# Patient Record
Sex: Male | Born: 2000 | Race: Black or African American | Hispanic: No | Marital: Single | Smoking: Never smoker
Health system: Southern US, Community
[De-identification: ages and names within clinical notes are randomized; demographics above are authoritative.]

## PROBLEM LIST (undated history)

## (undated) DIAGNOSIS — I498 Other specified cardiac arrhythmias: Secondary | ICD-10-CM

## (undated) HISTORY — PX: ADENOIDECTOMY: SUR15

## (undated) HISTORY — PX: TYMPANOPLASTY: SHX33

---
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
## 2020-02-01 ENCOUNTER — Ambulatory Visit: Payer: Self-pay

## 2020-02-03 ENCOUNTER — Ambulatory Visit: Payer: BLUE CROSS/BLUE SHIELD | Attending: Internal Medicine

## 2020-02-03 DIAGNOSIS — Z20822 Contact with and (suspected) exposure to covid-19: Secondary | ICD-10-CM

## 2020-02-04 LAB — NOVEL CORONAVIRUS, NAA: SARS-CoV-2, NAA: NOT DETECTED

## 2022-06-29 ENCOUNTER — Other Ambulatory Visit: Payer: Self-pay

## 2022-06-29 ENCOUNTER — Encounter (HOSPITAL_COMMUNITY): Payer: Self-pay | Admitting: Emergency Medicine

## 2022-06-29 ENCOUNTER — Emergency Department (HOSPITAL_COMMUNITY)
Admission: EM | Admit: 2022-06-29 | Discharge: 2022-06-29 | Discharge status: Against Medical Advice | Payer: BLUE CROSS/BLUE SHIELD | Attending: Emergency Medicine | Admitting: Emergency Medicine

## 2022-06-29 DIAGNOSIS — R197 Diarrhea, unspecified: Secondary | ICD-10-CM | POA: Insufficient documentation

## 2022-06-29 DIAGNOSIS — R42 Dizziness and giddiness: Secondary | ICD-10-CM | POA: Insufficient documentation

## 2022-06-29 DIAGNOSIS — R112 Nausea with vomiting, unspecified: Secondary | ICD-10-CM | POA: Insufficient documentation

## 2022-06-29 DIAGNOSIS — Z5321 Procedure and treatment not carried out due to patient leaving prior to being seen by health care provider: Secondary | ICD-10-CM | POA: Insufficient documentation

## 2022-06-29 DIAGNOSIS — R109 Unspecified abdominal pain: Secondary | ICD-10-CM | POA: Insufficient documentation

## 2022-06-29 LAB — COMPREHENSIVE METABOLIC PANEL
ALT: 32 U/L (ref 0–44)
AST: 29 U/L (ref 15–41)
Albumin: 4.4 g/dL (ref 3.5–5.0)
Alkaline Phosphatase: 77 U/L (ref 38–126)
Anion gap: 8 (ref 5–15)
BUN: 11 mg/dL (ref 6–20)
CO2: 27 mmol/L (ref 22–32)
Calcium: 9.3 mg/dL (ref 8.9–10.3)
Chloride: 103 mmol/L (ref 98–111)
Creatinine, Ser: 1.2 mg/dL (ref 0.61–1.24)
GFR, Estimated: >60 mL/min (ref >60)
Glucose, Bld: 152 mg/dL — ABNORMAL HIGH (ref 70–99)
Potassium: 3.4 mmol/L — ABNORMAL LOW (ref 3.5–5.1)
Sodium: 138 mmol/L (ref 135–145)
Total Bilirubin: 0.4 mg/dL (ref 0.3–1.2)
Total Protein: 7 g/dL (ref 6.5–8.1)

## 2022-06-29 LAB — CBC WITH DIFFERENTIAL/PLATELET
Abs Immature Granulocytes: 0.05 10*3/uL (ref 0.00–0.07)
Basophils Absolute: 0 10*3/uL (ref 0.0–0.1)
Basophils Relative: 1 %
Eosinophils Absolute: 0 10*3/uL (ref 0.0–0.5)
Eosinophils Relative: 0 %
HCT: 46.4 % (ref 39.0–52.0)
Hemoglobin: 15.1 g/dL (ref 13.0–17.0)
Immature Granulocytes: 1 %
Lymphocytes Relative: 17 %
Lymphs Abs: 1.5 10*3/uL (ref 0.7–4.0)
MCH: 27.8 pg (ref 26.0–34.0)
MCHC: 32.5 g/dL (ref 30.0–36.0)
MCV: 85.3 fL (ref 80.0–100.0)
Monocytes Absolute: 0.5 10*3/uL (ref 0.1–1.0)
Monocytes Relative: 5 %
Neutro Abs: 6.7 10*3/uL (ref 1.7–7.7)
Neutrophils Relative %: 76 %
Platelets: 292 10*3/uL (ref 150–400)
RBC: 5.44 MIL/uL (ref 4.22–5.81)
RDW: 13.2 % (ref 11.5–15.5)
WBC: 8.7 10*3/uL (ref 4.0–10.5)
nRBC: 0 % (ref 0.0–0.2)

## 2022-06-29 LAB — LIPASE, BLOOD: Lipase: 25 U/L (ref 11–51)

## 2022-06-29 NOTE — ED Notes (Signed)
Pt leaving due to not being seen quick enough 

## 2022-06-29 NOTE — ED Provider Triage Note (Signed)
Emergency Medicine Provider Triage Evaluation Note  Tod Abrahamsen , a 21 y.o. male  was evaluated in triage.  Pt complains of abdominal cramping, N/V/D and feeling lightheaded.  No syncope or LOC.  States vomited up everything in his stomach PTA.  Feels some abdominal cramping.  No fever or sick contacts.  Review of Systems  Positive: N/V/D, lightheaded Negative: fever  Physical Exam  BP (!) 161/81 (BP Location: Right Arm)   Pulse (!) 101   Temp 99.5 F (37.5 C) (Oral)   Resp 18   SpO2 99%   Gen:   Awake, no distress   Resp:  Normal effort  MSK:   Moves extremities without difficulty  Other:  Abdomen soft, non-tender  Medical Decision Making  Medically screening exam initiated at 12:28 AM.  Appropriate orders placed.  Conal Kirwan was informed that the remainder of the evaluation will be completed by another provider, this initial triage assessment does not replace that evaluation, and the importance of remaining in the ED until their evaluation is complete.  Lightheaded, N/V/D, stomach cramps.  Abdomen soft, non-tender on triage exam.  VSS.  Will check EKG, labs.   Garlon Hatchet, PA-C 06/29/22 0030

## 2022-06-29 NOTE — ED Triage Notes (Signed)
Patient reports feeling lightheaded with emesis and diarrhea this evening .

## 2022-06-29 NOTE — ED Notes (Addendum)
Pt stated he ingested delta 8.

## 2024-09-23 NOTE — Telephone Encounter (Signed)
 Copied from CRM #[id number]. Topic: Medication/Rx - Rx Refill >> Sep 23, 2024 11:03 AM Jadine C wrote: Derner, Rilley Latrell is calling for medication request (Obtain: Medication Name, Dosage, Quantity, Frequency, Quantity Requested (example: 30-day supply.)   Include all details related to the request(s) below:  Refill on  ALPRAZolam ALPRAZolam (XANAX) 0.5 mg tablet  Confirm and type the Best Contact Number below:  Patient/caller contact number:  [phone] / Jayvion            [] Home  [] Mobile  [] Work [] Other   [] Okay to leave a voicemail   Medication List:  Current Outpatient Medications:    ALPRAZolam (XANAX) 0.5 mg tablet, Take 1 tablet (0.5 mg total) by mouth 2 (two) times a day as needed for anxiety., Disp: 30 tablet, Rfl: 0   citalopram (CeleXA) 20 mg tablet, Take 1 tablet (20 mg total) by mouth daily., Disp: 30 tablet, Rfl: 1     Medication Request/Refills: Pharmacy Information (if applicable)   [] Not Applicable       []  Pharmacy listed  Send Medication Request un:[id number] [place], [place] - [address] AT [facility] OF [address] - PHONE: [phone] - FAX: [phone]                                                 [] Pharmacy not listed (added to pharmacy list in Epic) Send Medication Request to:      Listed Pharmacies: [id number] [place], [place] - [address] AT [facility] OF [address] - PHONE: [phone] - FAX: [phone]

## 2024-12-12 ENCOUNTER — Emergency Department (HOSPITAL_COMMUNITY)
Admission: EM | Admit: 2024-12-12 | Discharge: 2024-12-12 | Disposition: A | Discharge status: Home / Self Care | Payer: Worker's Compensation

## 2024-12-12 ENCOUNTER — Emergency Department (HOSPITAL_COMMUNITY): Payer: Worker's Compensation

## 2024-12-12 DIAGNOSIS — W19XXXA Unspecified fall, initial encounter: Secondary | ICD-10-CM

## 2024-12-12 DIAGNOSIS — W12XXXA Fall on and from scaffolding, initial encounter: Secondary | ICD-10-CM | POA: Diagnosis not present

## 2024-12-12 DIAGNOSIS — R1031 Right lower quadrant pain: Secondary | ICD-10-CM | POA: Insufficient documentation

## 2024-12-12 DIAGNOSIS — S0990XA Unspecified injury of head, initial encounter: Secondary | ICD-10-CM | POA: Insufficient documentation

## 2024-12-12 DIAGNOSIS — R519 Headache, unspecified: Secondary | ICD-10-CM | POA: Diagnosis present

## 2024-12-12 LAB — BASIC METABOLIC PANEL WITH GFR
Anion gap: 9 (ref 5–15)
BUN: 14 mg/dL (ref 6–20)
CO2: 28 mmol/L (ref 22–32)
Calcium: 9.1 mg/dL (ref 8.9–10.3)
Chloride: 103 mmol/L (ref 98–111)
Creatinine, Ser: 0.84 mg/dL (ref 0.61–1.24)
GFR, Estimated: >60 mL/min
Glucose, Bld: 90 mg/dL (ref 70–99)
Potassium: 4.2 mmol/L (ref 3.5–5.1)
Sodium: 140 mmol/L (ref 135–145)

## 2024-12-12 LAB — CBC WITH DIFFERENTIAL/PLATELET
Abs Immature Granulocytes: 0.01 K/uL (ref 0.00–0.07)
Basophils Absolute: 0 K/uL (ref 0.0–0.1)
Basophils Relative: 1 %
Eosinophils Absolute: 0.1 K/uL (ref 0.0–0.5)
Eosinophils Relative: 1 %
HCT: 47.9 % (ref 39.0–52.0)
Hemoglobin: 16.1 g/dL (ref 13.0–17.0)
Immature Granulocytes: 0 %
Lymphocytes Relative: 49 %
Lymphs Abs: 2.4 K/uL (ref 0.7–4.0)
MCH: 28.4 pg (ref 26.0–34.0)
MCHC: 33.6 g/dL (ref 30.0–36.0)
MCV: 84.6 fL (ref 80.0–100.0)
Monocytes Absolute: 0.5 K/uL (ref 0.1–1.0)
Monocytes Relative: 11 %
Neutro Abs: 1.8 K/uL (ref 1.7–7.7)
Neutrophils Relative %: 38 %
Platelets: 273 K/uL (ref 150–400)
RBC: 5.66 MIL/uL (ref 4.22–5.81)
RDW: 13.4 % (ref 11.5–15.5)
WBC: 4.8 K/uL (ref 4.0–10.5)
nRBC: 0 % (ref 0.0–0.2)

## 2024-12-12 MED ORDER — ONDANSETRON 4 MG PO TBDP
4.0000 mg | ORAL_TABLET | Freq: Once | ORAL | Status: AC
  Administered 2024-12-12: 4 mg via ORAL
  Filled 2024-12-12: qty 1

## 2024-12-12 MED ORDER — CYCLOBENZAPRINE HCL 10 MG PO TABS: 10.0000 mg | ORAL_TABLET | Freq: Two times a day (BID) | ORAL | 0 refills | Status: AC | PRN

## 2024-12-12 MED ORDER — IOHEXOL 300 MG/ML  SOLN
100.0000 mL | Freq: Once | INTRAMUSCULAR | Status: AC | PRN
  Administered 2024-12-12: 100 mL via INTRAVENOUS

## 2024-12-12 MED ORDER — NAPROXEN 500 MG PO TABS: 500.0000 mg | ORAL_TABLET | Freq: Two times a day (BID) | ORAL | 0 refills | Status: AC

## 2024-12-12 NOTE — ED Triage Notes (Addendum)
 Patient reports falling backward off 6 foot ladder hitting back of head denies LOC/thinners. Patient endorses pain to top and back of head, no active bleeding noted, patient c/o cervical spine pain. Placed in c collar at this time. Patient is alert and oriented x 4. Airway patent, respirations even and unlabored. Skin normal, warm and dry. PERRLA. Patient has been nauseous since fall.

## 2024-12-12 NOTE — ED Provider Notes (Signed)
 " [place] EMERGENCY DEPARTMENT AT [facility] Provider Note   CSN: [id number] Arrival date & time: 12/12/24  1736     Patient presents with: Mitchell Carney is a 24 y.o. male.  24 year old male presents ED via POV with complaints of mechanical fall off a 6 foot scaffolding.  Patient advised his foot got caught and he fell backwards onto smooth concrete.  He reports hitting the back of his head and neck during the incident.  Patient did not lose consciousness and is not on blood thinners.  Patient does endorse right lower abdominal pain pain in the back of his head and cervical spine.  Patient was able to ambulate after the incident and drove himself to the emergency department.  No reported visual disturbances but does endorse some nausea.  No significant past medical history.  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     Prior to Admission medications  Medication Sig Start Date End Date Taking? Authorizing Provider  cyclobenzaprine  (FLEXERIL ) 10 MG tablet Take 1 tablet (10 mg total) by mouth 2 (two) times daily as needed for muscle spasms. 12/12/24  Yes Myriam Fonda RAMAN, PA-C  naproxen  (NAPROSYN ) 500 MG tablet Take 1 tablet (500 mg total) by mouth 2 (two) times daily. 12/12/24  Yes Myriam Fonda RAMAN, PA-C  benzonatate  (TESSALON ) 100 MG capsule Take 2 capsules (200 mg total) by mouth 2 (two) times daily as needed for cough. 01/28/16   Nevelyn Nat Norris, PA-C  ondansetron  (ZOFRAN ) 4 MG/5ML solution Take 5 mLs (4 mg total) by mouth 3 (three) times daily. 09/02/12   Penne Noe, MD  oxymetazoline  (AFRIN NASAL SPRAY) 0.05 % nasal spray Place 1 spray into both nostrils 2 (two) times daily. 1-2 sprays of solution in each nostril twice daily for up to 3 days 01/28/16   Nevelyn Nat Norris, PA-C    Allergies: Amoxicillin, Codeine, and Penicillins    Review of Systems  Gastrointestinal:  Positive for nausea.  Musculoskeletal:  Positive for back  pain and neck pain.  All other systems reviewed and are negative.   Updated Vital Signs BP 126/79 (BP Location: Right Arm)   Pulse 91   Temp 98.8 F (37.1 C) (Oral)   Resp 18   Wt 77.9 kg   SpO2 98%   Physical Exam Vitals and nursing note reviewed.  Constitutional:      General: He is not in acute distress.    Appearance: Normal appearance. He is not ill-appearing.  HENT:     Head: Normocephalic and atraumatic.     Nose: Nose normal.  Eyes:     Extraocular Movements: Extraocular movements intact.     Conjunctiva/sclera: Conjunctivae normal.     Pupils: Pupils are equal, round, and reactive to light.     Comments: Pupils are equal and reactive bilaterally, no difficulty or pain with EOM.  No visual field deficits.  Cardiovascular:     Rate and Rhythm: Normal rate.  Pulmonary:     Effort: Pulmonary effort is normal. No respiratory distress.     Breath sounds: Normal breath sounds.     Comments: Lungs are clear to auscultation in all fields and no pain with deep inspiration. Abdominal:     General: Abdomen is flat. There is no distension.     Palpations: Abdomen is soft.     Tenderness: There is abdominal tenderness. There is no guarding.     Comments: Patient does have pain in his  right lower quadrant exacerbated with movement.  Musculoskeletal:        General: Normal range of motion.     Cervical back: Normal range of motion.     Comments: There is some pain to palpation on the back of his head with no obvious wounds.  No pain to palpation down his thoracic or lumbar spine.  C-collar is on patient during exam, cervical spine is unable to be palpated at this time.  Skin:    General: Skin is warm.     Capillary Refill: Capillary refill takes less than 2 seconds.     Findings: No bruising.  Neurological:     General: No focal deficit present.     Mental Status: He is alert.  Psychiatric:        Mood and Affect: Mood normal.        Behavior: Behavior normal.     (all  labs ordered are listed, but only abnormal results are displayed) Labs Reviewed  BASIC METABOLIC PANEL WITH GFR  CBC WITH DIFFERENTIAL/PLATELET    EKG: None  Radiology: CT Head Wo Contrast Result Date: 12/12/2024 EXAM: CT HEAD WITHOUT 12/12/2024 08:55:54 PM TECHNIQUE: CT of the head was performed without the administration of intravenous contrast. Automated exposure control, iterative reconstruction, and/or weight based adjustment of the mA/kV was utilized to reduce the radiation dose to as low as reasonably achievable. COMPARISON: None available. CLINICAL HISTORY: Head trauma, moderate-severe Head trauma, moderate-severe FINDINGS: BRAIN AND VENTRICLES: No acute intracranial hemorrhage. No mass effect or midline shift. No extra-axial fluid collection. No evidence of acute infarct. No hydrocephalus. ORBITS: No acute abnormality. SINUSES AND MASTOIDS: No acute abnormality. SOFT TISSUES AND SKULL: No acute skull fracture. No acute soft tissue abnormality. IMPRESSION: 1. No acute intracranial abnormality. Electronically signed by: Franky Crease MD 12/12/2024 09:10 PM EST RP Workstation: [id number]   CT Cervical Spine Wo Contrast Result Date: 12/12/2024 EXAM: CT CERVICAL SPINE WITHOUT CONTRAST 12/12/2024 08:55:54 PM TECHNIQUE: CT of the cervical spine was performed without the administration of intravenous contrast. Multiplanar reformatted images are provided for review. Automated exposure control, iterative reconstruction, and/or weight based adjustment of the mA/kV was utilized to reduce the radiation dose to as low as reasonably achievable. COMPARISON: None available. CLINICAL HISTORY: Neck trauma, midline tenderness (Age 56-64y) Neck trauma, midline tenderness (Age 86-64y) FINDINGS: BONES AND ALIGNMENT: No acute fracture or traumatic malalignment. DEGENERATIVE CHANGES: No significant degenerative changes. SOFT TISSUES: No prevertebral soft tissue swelling. IMPRESSION: 1. No significant abnormality  Electronically signed by: Franky Crease MD 12/12/2024 09:10 PM EST RP Workstation: [id number]   CT CHEST ABDOMEN PELVIS W CONTRAST Result Date: 12/12/2024 EXAM: CT CHEST, ABDOMEN AND PELVIS WITH CONTRAST 12/12/2024 08:55:54 PM TECHNIQUE: CT of the chest, abdomen and pelvis was performed with the administration of 100 mL of intravenous iohexol  (OMNIPAQUE ) 300 MG/ML solution. Multiplanar reformatted images are provided for review. Automated exposure control, iterative reconstruction, and/or weight based adjustment of the mA/kV was utilized to reduce the radiation dose to as low as reasonably achievable. COMPARISON: None available. CLINICAL HISTORY: Polytrauma, blunt. FINDINGS: CHEST: MEDIASTINUM AND LYMPH NODES: Heart and pericardium are unremarkable. The central airways are clear. No mediastinal, hilar or axillary lymphadenopathy. LUNGS AND PLEURA: No focal consolidation or pulmonary edema. No pleural effusion. No pneumothorax. ABDOMEN AND PELVIS: LIVER: Diffuse fatty infiltration of the liver. GALLBLADDER AND BILE DUCTS: Unremarkable. No biliary ductal dilatation. SPLEEN: No acute abnormality. PANCREAS: No acute abnormality. ADRENAL GLANDS: No acute abnormality. KIDNEYS, URETERS AND BLADDER: No stones in  the kidneys or ureters. No hydronephrosis. No perinephric or periureteral stranding. Urinary bladder is unremarkable. GI AND BOWEL: Stomach demonstrates no acute abnormality. There is no bowel obstruction. Normal appendix. REPRODUCTIVE ORGANS: No acute abnormality. PERITONEUM AND RETROPERITONEUM: No ascites. No free air. VASCULATURE: Aorta is normal in caliber. ABDOMINAL AND PELVIS LYMPH NODES: No lymphadenopathy. REPRODUCTIVE ORGANS: No acute abnormality. BONES AND SOFT TISSUES: No acute osseous abnormality. No focal soft tissue abnormality. IMPRESSION: 1. No evidence of acute traumatic injury. 2. Fatty liver. Electronically signed by: Franky Crease MD 12/12/2024 09:09 PM EST RP Workstation: [id number]      Procedures   Medications Ordered in the ED  ondansetron  (ZOFRAN -ODT) disintegrating tablet 4 mg (4 mg Oral Given 12/12/24 1859)  iohexol  (OMNIPAQUE ) 300 MG/ML solution 100 mL (100 mLs Intravenous Contrast Given 12/12/24 2026)   24 y.o. male presents to the ED with complaints of mechanical fall with no LOC, the differential diagnosis includes intracranial bleed, cranial fracture, cervical spine fracture, intra-abdominal injury secondary to fall, pneumothorax, (Ddx)  On arrival pt is nontoxic, vitals unremarkable. Exam significant for pain in the right lower quadrant exacerbated by movement and pain in the back of his head to palpation.  I ordered medication Zofran  for nausea  Lab Tests:  BMP CBC with no significant findings  Imaging Studies ordered:  I ordered imaging studies which included chest abdomen pelvis with contrast and CT head and CT cervical spine without.  No significant findings on CT.  ED Course:   Upon initial evaluation patient sitting comfortably in ED bed in no acute distress nontoxic-appearing.  With patient's significant mechanism of injury falling 6 feet to the back of his head and right lower quadrant pain presenting after fall CT chest abdomen pelvis will be ordered along with CT head and cervical spine.  Patient is able to take deep inspirations without increased pain and lungs are clear to auscultation all fields.  No concern for pneumothorax.  No other reported pain in upper or lower extremities.  Patient was able to ambulate after the fall and drove himself to the emergency department without any difficulties.  No current headaches or other neurological deficits.  Patient is complaining of some nausea, will be given Zofran .  Patient was advised of findings and CT collar was removed.  No lacerations or notable hematomas noted throughout back of head.  Patient did not have any pain to palpation down C-spine.  Patient reported muscular pain in his left trapezius when  performing range of motion of his neck.  Patient has no neck stiffness or pain with range of motion in his neck.  Patient was given muscle relaxer and naproxen  for pain control.  Patient was advised to follow-up with PCP for reassessment and to avoid strenuous activities or dangerous activities during recovery.  Patient is ambulatory in the ED. Patient was given strict return precautions and was comfortable with discharge at this time.  Portions of this note were generated with Scientist, clinical (histocompatibility and immunogenetics). Dictation errors may occur despite best attempts at proofreading.    Final diagnoses:  Fall, initial encounter  Minor head injury, initial encounter    ED Discharge Orders          Ordered    cyclobenzaprine  (FLEXERIL ) 10 MG tablet  2 times daily PRN        12/12/24 2130    naproxen  (NAPROSYN ) 500 MG tablet  2 times daily        12/12/24 2130  Myriam Fonda GORMAN DEVONNA 12/12/24 2203    Ula Prentice SAUNDERS, MD 12/12/24 [phone]  "

## 2024-12-12 NOTE — Discharge Instructions (Addendum)
 Exam and imaging are reassuring today.  I provided you with a primary care follow-up if you do not want to follow-up with your current primary care.  Please follow-up with primary care soon as possible.  Monitor for any worsening symptoms including dizziness, fainting, visual disturbances, vomiting.  If any of these or other concerning symptoms arise please return to the ED for further evaluation.  I have sent you in naproxen  and Flexeril  for pain.  Flexeril  is a muscle relaxer and can have sedating effects please do not operate machinery following taking the medication.
